# Patient Record
Sex: Female | Born: 1985 | Race: Black or African American | Hispanic: No | Marital: Married | Smoking: Never smoker
Health system: Southern US, Community
[De-identification: ages and names within clinical notes are randomized; demographics above are authoritative.]

## PROBLEM LIST (undated history)

## (undated) DIAGNOSIS — R87619 Unspecified abnormal cytological findings in specimens from cervix uteri: Secondary | ICD-10-CM

## (undated) DIAGNOSIS — R51 Headache: Secondary | ICD-10-CM

## (undated) DIAGNOSIS — K219 Gastro-esophageal reflux disease without esophagitis: Secondary | ICD-10-CM

## (undated) DIAGNOSIS — S82209A Unspecified fracture of shaft of unspecified tibia, initial encounter for closed fracture: Secondary | ICD-10-CM

## (undated) DIAGNOSIS — S069X9A Unspecified intracranial injury with loss of consciousness of unspecified duration, initial encounter: Secondary | ICD-10-CM

## (undated) DIAGNOSIS — IMO0002 Reserved for concepts with insufficient information to code with codable children: Secondary | ICD-10-CM

## (undated) DIAGNOSIS — M779 Enthesopathy, unspecified: Secondary | ICD-10-CM

## (undated) DIAGNOSIS — S82409A Unspecified fracture of shaft of unspecified fibula, initial encounter for closed fracture: Secondary | ICD-10-CM

## (undated) DIAGNOSIS — S069X1A Unspecified intracranial injury with loss of consciousness of 30 minutes or less, initial encounter: Secondary | ICD-10-CM

## (undated) DIAGNOSIS — R5383 Other fatigue: Secondary | ICD-10-CM

## (undated) HISTORY — PX: OTHER SURGICAL HISTORY: SHX169

## (undated) HISTORY — DX: Headache: R51

## (undated) HISTORY — PX: COLPOSCOPY W/ BIOPSY / CURETTAGE: SUR283

## (undated) HISTORY — PX: ARTHROSCOPIC REPAIR PCL: SUR81

## (undated) HISTORY — DX: Other fatigue: R53.83

## (undated) HISTORY — PX: DILATION AND CURETTAGE OF UTERUS: SHX78

---

## 2010-05-31 ENCOUNTER — Emergency Department (HOSPITAL_COMMUNITY): Admission: EM | Admit: 2010-05-31 | Discharge: 2010-05-31 | Payer: Self-pay | Admitting: Emergency Medicine

## 2010-09-29 LAB — POCT I-STAT, CHEM 8
BUN: 4 mg/dL — ABNORMAL LOW (ref 6–23)
Calcium, Ion: 1.14 mmol/L (ref 1.12–1.32)
Chloride: 103 meq/L (ref 96–112)
Creatinine, Ser: 0.8 mg/dL (ref 0.4–1.2)
Glucose, Bld: 90 mg/dL (ref 70–99)
HCT: 44 % (ref 36.0–46.0)
Hemoglobin: 15 g/dL (ref 12.0–15.0)
Potassium: 3.5 meq/L (ref 3.5–5.1)
Sodium: 139 meq/L (ref 135–145)
TCO2: 27 mmol/L (ref 0–100)

## 2010-09-29 LAB — POCT CARDIAC MARKERS: Myoglobin, poc: 49.2 ng/mL (ref 12–200)

## 2010-10-28 ENCOUNTER — Other Ambulatory Visit: Payer: Self-pay | Admitting: Family Medicine

## 2010-10-28 DIAGNOSIS — R102 Pelvic and perineal pain: Secondary | ICD-10-CM

## 2010-10-30 ENCOUNTER — Ambulatory Visit
Admission: RE | Admit: 2010-10-30 | Discharge: 2010-10-30 | Disposition: A | Discharge status: Home / Self Care | Payer: 59 | Source: Ambulatory Visit | Attending: Family Medicine | Admitting: Family Medicine

## 2010-10-30 DIAGNOSIS — R102 Pelvic and perineal pain: Secondary | ICD-10-CM

## 2010-11-04 ENCOUNTER — Other Ambulatory Visit: Payer: Self-pay | Admitting: Family Medicine

## 2010-11-04 DIAGNOSIS — R1032 Left lower quadrant pain: Secondary | ICD-10-CM

## 2010-11-05 ENCOUNTER — Ambulatory Visit
Admission: RE | Admit: 2010-11-05 | Discharge: 2010-11-05 | Disposition: A | Discharge status: Home / Self Care | Payer: 59 | Source: Ambulatory Visit | Attending: Family Medicine | Admitting: Family Medicine

## 2010-11-05 DIAGNOSIS — R1032 Left lower quadrant pain: Secondary | ICD-10-CM

## 2010-11-06 ENCOUNTER — Inpatient Hospital Stay: Admission: RE | Admit: 2010-11-06 | Payer: 59 | Source: Ambulatory Visit

## 2010-11-09 ENCOUNTER — Ambulatory Visit
Admission: RE | Admit: 2010-11-09 | Discharge: 2010-11-09 | Disposition: A | Discharge status: Home / Self Care | Payer: 59 | Source: Ambulatory Visit | Attending: Family Medicine | Admitting: Family Medicine

## 2010-11-09 ENCOUNTER — Other Ambulatory Visit: Payer: Self-pay | Admitting: Family Medicine

## 2010-11-09 DIAGNOSIS — R1032 Left lower quadrant pain: Secondary | ICD-10-CM

## 2010-11-19 ENCOUNTER — Other Ambulatory Visit: Payer: Self-pay | Admitting: Family Medicine

## 2010-11-23 LAB — HIV ANTIBODY (ROUTINE TESTING W REFLEX): HIV: NONREACTIVE

## 2010-11-23 LAB — HEPATITIS B SURFACE ANTIGEN: Hepatitis B Surface Ag: NEGATIVE

## 2010-11-23 LAB — RPR: RPR: NONREACTIVE

## 2010-12-25 ENCOUNTER — Other Ambulatory Visit: Payer: Self-pay | Admitting: Obstetrics & Gynecology

## 2010-12-25 DIAGNOSIS — Z3682 Encounter for antenatal screening for nuchal translucency: Secondary | ICD-10-CM

## 2010-12-30 ENCOUNTER — Inpatient Hospital Stay (HOSPITAL_COMMUNITY)
Admission: AD | Admit: 2010-12-30 | Discharge: 2010-12-30 | Disposition: A | Discharge status: Home / Self Care | Payer: 59 | Source: Ambulatory Visit | Attending: Obstetrics | Admitting: Obstetrics

## 2010-12-30 DIAGNOSIS — O210 Mild hyperemesis gravidarum: Secondary | ICD-10-CM | POA: Insufficient documentation

## 2010-12-30 DIAGNOSIS — R197 Diarrhea, unspecified: Secondary | ICD-10-CM

## 2010-12-30 LAB — URINALYSIS, ROUTINE W REFLEX MICROSCOPIC
Bilirubin Urine: NEGATIVE
Glucose, UA: NEGATIVE mg/dL
Ketones, ur: NEGATIVE mg/dL
Protein, ur: NEGATIVE mg/dL
pH: 6 (ref 5.0–8.0)

## 2010-12-30 LAB — URINE MICROSCOPIC-ADD ON

## 2010-12-31 ENCOUNTER — Ambulatory Visit (HOSPITAL_COMMUNITY)
Admission: RE | Admit: 2010-12-31 | Discharge: 2010-12-31 | Disposition: A | Discharge status: Home / Self Care | Payer: 59 | Source: Ambulatory Visit | Attending: Obstetrics & Gynecology | Admitting: Obstetrics & Gynecology

## 2010-12-31 DIAGNOSIS — Z3682 Encounter for antenatal screening for nuchal translucency: Secondary | ICD-10-CM

## 2010-12-31 DIAGNOSIS — Z3689 Encounter for other specified antenatal screening: Secondary | ICD-10-CM | POA: Insufficient documentation

## 2010-12-31 DIAGNOSIS — O3510X0 Maternal care for (suspected) chromosomal abnormality in fetus, unspecified, not applicable or unspecified: Secondary | ICD-10-CM | POA: Insufficient documentation

## 2010-12-31 DIAGNOSIS — O351XX0 Maternal care for (suspected) chromosomal abnormality in fetus, not applicable or unspecified: Secondary | ICD-10-CM | POA: Insufficient documentation

## 2011-01-19 ENCOUNTER — Ambulatory Visit (HOSPITAL_COMMUNITY): Payer: 59

## 2011-01-19 ENCOUNTER — Ambulatory Visit (HOSPITAL_COMMUNITY)
Admission: RE | Admit: 2011-01-19 | Discharge: 2011-01-19 | Disposition: A | Discharge status: Home / Self Care | Payer: 59 | Source: Ambulatory Visit | Attending: Obstetrics & Gynecology | Admitting: Obstetrics & Gynecology

## 2011-01-19 DIAGNOSIS — O351XX0 Maternal care for (suspected) chromosomal abnormality in fetus, not applicable or unspecified: Secondary | ICD-10-CM | POA: Insufficient documentation

## 2011-01-19 DIAGNOSIS — O3510X0 Maternal care for (suspected) chromosomal abnormality in fetus, unspecified, not applicable or unspecified: Secondary | ICD-10-CM | POA: Insufficient documentation

## 2011-01-21 ENCOUNTER — Other Ambulatory Visit: Payer: Self-pay | Admitting: Maternal and Fetal Medicine

## 2011-05-26 LAB — RPR: RPR: NONREACTIVE

## 2011-05-26 LAB — RUBELLA ANTIBODY, IGM: Rubella: IMMUNE

## 2011-05-26 LAB — HIV ANTIBODY (ROUTINE TESTING W REFLEX): HIV: NONREACTIVE

## 2011-06-14 ENCOUNTER — Inpatient Hospital Stay (HOSPITAL_COMMUNITY)
Admission: EM | Admit: 2011-06-14 | Discharge: 2011-06-14 | Disposition: A | Discharge status: Home / Self Care | Payer: 59 | Source: Ambulatory Visit | Attending: Obstetrics & Gynecology | Admitting: Obstetrics & Gynecology

## 2011-06-14 ENCOUNTER — Encounter (HOSPITAL_COMMUNITY): Payer: Self-pay | Admitting: *Deleted

## 2011-06-14 ENCOUNTER — Inpatient Hospital Stay (HOSPITAL_COMMUNITY): Payer: 59

## 2011-06-14 DIAGNOSIS — Y92410 Unspecified street and highway as the place of occurrence of the external cause: Secondary | ICD-10-CM | POA: Insufficient documentation

## 2011-06-14 DIAGNOSIS — O99891 Other specified diseases and conditions complicating pregnancy: Secondary | ICD-10-CM | POA: Insufficient documentation

## 2011-06-14 HISTORY — DX: Unspecified fracture of shaft of unspecified tibia, initial encounter for closed fracture: S82.209A

## 2011-06-14 HISTORY — DX: Unspecified fracture of shaft of unspecified fibula, initial encounter for closed fracture: S82.409A

## 2011-06-14 HISTORY — DX: Unspecified abnormal cytological findings in specimens from cervix uteri: R87.619

## 2011-06-14 HISTORY — DX: Unspecified intracranial injury with loss of consciousness of unspecified duration, initial encounter: S06.9X9A

## 2011-06-14 HISTORY — DX: Unspecified intracranial injury with loss of consciousness of 30 minutes or less, initial encounter: S06.9X1A

## 2011-06-14 HISTORY — DX: Headache: R51

## 2011-06-14 HISTORY — DX: Reserved for concepts with insufficient information to code with codable children: IMO0002

## 2011-06-14 LAB — CBC
Platelets: 238 10*3/uL (ref 150–400)
RBC: 3.78 MIL/uL — ABNORMAL LOW (ref 3.87–5.11)
RDW: 13.3 % (ref 11.5–15.5)
WBC: 13.9 10*3/uL — ABNORMAL HIGH (ref 4.0–10.5)

## 2011-06-14 LAB — KLEIHAUER-BETKE STAIN
# Vials RhIg: 1
Fetal Cells %: 0.2 %
Quantitation Fetal Hemoglobin: 10 mL

## 2011-06-14 MED ORDER — OXYCODONE-ACETAMINOPHEN 5-325 MG PO TABS: 2.0000 | ORAL_TABLET | Freq: Four times a day (QID) | ORAL | Status: AC | PRN

## 2011-06-14 MED ORDER — ACETAMINOPHEN 500 MG PO TABS
1000.0000 mg | ORAL_TABLET | Freq: Four times a day (QID) | ORAL | Status: DC | PRN
  Administered 2011-06-14: 1000 mg via ORAL
  Filled 2011-06-14: qty 2
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
## 2011-06-17 LAB — STREP B DNA PROBE: GBS: POSITIVE

## 2011-07-04 ENCOUNTER — Inpatient Hospital Stay (HOSPITAL_COMMUNITY)
Admission: AD | Admit: 2011-07-04 | Discharge: 2011-07-07 | DRG: 775 | Disposition: A | Discharge status: Home / Self Care | Payer: 59 | Source: Ambulatory Visit | Attending: Obstetrics & Gynecology | Admitting: Obstetrics & Gynecology

## 2011-07-04 ENCOUNTER — Encounter (HOSPITAL_COMMUNITY): Payer: Self-pay | Admitting: Anesthesiology

## 2011-07-04 ENCOUNTER — Encounter (HOSPITAL_COMMUNITY): Payer: Self-pay

## 2011-07-04 ENCOUNTER — Inpatient Hospital Stay (HOSPITAL_COMMUNITY): Payer: 59 | Admitting: Anesthesiology

## 2011-07-04 DIAGNOSIS — O4700 False labor before 37 completed weeks of gestation, unspecified trimester: Secondary | ICD-10-CM | POA: Diagnosis present

## 2011-07-04 DIAGNOSIS — Z22330 Carrier of Group B streptococcus: Secondary | ICD-10-CM

## 2011-07-04 DIAGNOSIS — O99892 Other specified diseases and conditions complicating childbirth: Secondary | ICD-10-CM | POA: Diagnosis present

## 2011-07-04 DIAGNOSIS — O4290 Premature rupture of membranes, unspecified as to length of time between rupture and onset of labor, unspecified weeks of gestation: Secondary | ICD-10-CM | POA: Diagnosis present

## 2011-07-04 LAB — CBC
HCT: 35.8 % — ABNORMAL LOW (ref 36.0–46.0)
MCH: 31 pg (ref 26.0–34.0)
MCHC: 33.5 g/dL (ref 30.0–36.0)
MCV: 92.5 fL (ref 78.0–100.0)
Platelets: 211 10*3/uL (ref 150–400)
RDW: 13.6 % (ref 11.5–15.5)
WBC: 15.4 10*3/uL — ABNORMAL HIGH (ref 4.0–10.5)

## 2011-07-04 MED ORDER — PHENYLEPHRINE 40 MCG/ML (10ML) SYRINGE FOR IV PUSH (FOR BLOOD PRESSURE SUPPORT): 80.0000 ug | PREFILLED_SYRINGE | INTRAVENOUS | Status: DC | PRN

## 2011-07-04 MED ORDER — CITRIC ACID-SODIUM CITRATE 334-500 MG/5ML PO SOLN: 30.0000 mL | ORAL | Status: DC | PRN

## 2011-07-04 MED ORDER — FENTANYL 2.5 MCG/ML BUPIVACAINE 1/10 % EPIDURAL INFUSION (WH - ANES): 14.0000 mL/h | INTRAMUSCULAR | Status: DC

## 2011-07-04 MED ORDER — FENTANYL 2.5 MCG/ML BUPIVACAINE 1/10 % EPIDURAL INFUSION (WH - ANES)
14.0000 mL/h | INTRAMUSCULAR | Status: DC
Start: 2011-07-04 — End: 2011-07-05
  Administered 2011-07-05: 14 mL/h via EPIDURAL
  Filled 2011-07-04 (×2): qty 60

## 2011-07-04 MED ORDER — LIDOCAINE HCL (PF) 1 % IJ SOLN: 30.0000 mL | INTRAMUSCULAR | Status: DC | PRN

## 2011-07-04 MED ORDER — EPHEDRINE 5 MG/ML INJ: 10.0000 mg | INTRAVENOUS | Status: DC | PRN

## 2011-07-04 MED ORDER — FLEET ENEMA 7-19 GM/118ML RE ENEM: 1.0000 | ENEMA | RECTAL | Status: DC | PRN

## 2011-07-04 MED ORDER — OXYTOCIN 20 UNITS IN LACTATED RINGERS INFUSION - SIMPLE: 125.0000 mL/h | Freq: Once | INTRAVENOUS | Status: DC

## 2011-07-04 MED ORDER — LIDOCAINE HCL 1.5 % IJ SOLN
INTRAMUSCULAR | Status: DC | PRN
  Administered 2011-07-04 (×2): 5 mL via EPIDURAL

## 2011-07-04 MED ORDER — BUTORPHANOL TARTRATE 2 MG/ML IJ SOLN: 1.0000 mg | INTRAMUSCULAR | Status: DC | PRN

## 2011-07-04 MED ORDER — OXYCODONE-ACETAMINOPHEN 5-325 MG PO TABS: 2.0000 | ORAL_TABLET | ORAL | Status: DC | PRN

## 2011-07-04 MED ORDER — LACTATED RINGERS IV SOLN
INTRAVENOUS | Status: DC
  Administered 2011-07-05: 03:00:00 via INTRAVENOUS

## 2011-07-04 MED ORDER — OXYTOCIN 20 UNITS IN LACTATED RINGERS INFUSION - SIMPLE
125.0000 mL/h | Freq: Once | INTRAVENOUS | Status: AC
  Administered 2011-07-05: 500 mL/h via INTRAVENOUS

## 2011-07-04 MED ORDER — LACTATED RINGERS IV SOLN: 500.0000 mL | INTRAVENOUS | Status: DC | PRN

## 2011-07-04 MED ORDER — LACTATED RINGERS IV SOLN: 500.0000 mL | Freq: Once | INTRAVENOUS | Status: DC

## 2011-07-04 MED ORDER — ONDANSETRON HCL 4 MG/2ML IJ SOLN: 4.0000 mg | Freq: Four times a day (QID) | INTRAMUSCULAR | Status: DC | PRN

## 2011-07-04 MED ORDER — PENICILLIN G POTASSIUM 5000000 UNITS IJ SOLR
5.0000 10*6.[IU] | Freq: Once | INTRAVENOUS | Status: DC
  Administered 2011-07-04: 5 10*6.[IU] via INTRAVENOUS
  Filled 2011-07-04: qty 5

## 2011-07-04 MED ORDER — BUTORPHANOL TARTRATE 2 MG/ML IJ SOLN
1.0000 mg | INTRAMUSCULAR | Status: DC | PRN
  Administered 2011-07-04 (×4): 1 mg via INTRAVENOUS
  Filled 2011-07-04 (×4): qty 1

## 2011-07-04 MED ORDER — ACETAMINOPHEN 325 MG PO TABS: 650.0000 mg | ORAL_TABLET | ORAL | Status: DC | PRN

## 2011-07-04 MED ORDER — DIPHENHYDRAMINE HCL 50 MG/ML IJ SOLN: 12.5000 mg | INTRAMUSCULAR | Status: DC | PRN

## 2011-07-04 MED ORDER — OXYTOCIN BOLUS FROM INFUSION
500.0000 mL | Freq: Once | INTRAVENOUS | Status: DC
  Filled 2011-07-04: qty 500

## 2011-07-04 MED ORDER — TERBUTALINE SULFATE 1 MG/ML IJ SOLN: 0.2500 mg | Freq: Once | INTRAMUSCULAR | Status: AC | PRN

## 2011-07-04 MED ORDER — LIDOCAINE HCL (PF) 1 % IJ SOLN
30.0000 mL | INTRAMUSCULAR | Status: DC | PRN
  Filled 2011-07-04 (×2): qty 30

## 2011-07-04 MED ORDER — FENTANYL 2.5 MCG/ML BUPIVACAINE 1/10 % EPIDURAL INFUSION (WH - ANES)
INTRAMUSCULAR | Status: DC | PRN
  Administered 2011-07-04: 14 mL/h via EPIDURAL

## 2011-07-04 MED ORDER — EPHEDRINE 5 MG/ML INJ
10.0000 mg | INTRAVENOUS | Status: DC | PRN
Start: 2011-07-04 — End: 2011-07-05
  Filled 2011-07-04: qty 4

## 2011-07-04 MED ORDER — DEXTROSE 5 % IV SOLN
2.5000 10*6.[IU] | INTRAVENOUS | Status: DC
  Administered 2011-07-04 (×4): 2.5 10*6.[IU] via INTRAVENOUS
  Filled 2011-07-04 (×6): qty 2.5

## 2011-07-04 MED ORDER — PENICILLIN G POTASSIUM 5000000 UNITS IJ SOLR
2.5000 10*6.[IU] | INTRAVENOUS | Status: DC
  Administered 2011-07-04 – 2011-07-05 (×2): 2.5 10*6.[IU] via INTRAVENOUS
  Filled 2011-07-04 (×5): qty 2.5

## 2011-07-04 MED ORDER — PHENYLEPHRINE 40 MCG/ML (10ML) SYRINGE FOR IV PUSH (FOR BLOOD PRESSURE SUPPORT)
80.0000 ug | PREFILLED_SYRINGE | INTRAVENOUS | Status: DC | PRN
  Filled 2011-07-04: qty 5

## 2011-07-04 MED ORDER — LACTATED RINGERS IV SOLN
INTRAVENOUS | Status: DC
  Administered 2011-07-04 (×3): via INTRAVENOUS

## 2011-07-04 MED ORDER — OXYTOCIN 20 UNITS IN LACTATED RINGERS INFUSION - SIMPLE
1.0000 m[IU]/min | INTRAVENOUS | Status: DC
  Administered 2011-07-04: 2 m[IU]/min via INTRAVENOUS

## 2011-07-04 MED ORDER — OXYTOCIN BOLUS FROM INFUSION
500.0000 mL | Freq: Once | INTRAVENOUS | Status: DC
  Filled 2011-07-04: qty 1000
  Filled 2011-07-04: qty 500

## 2011-07-04 MED ORDER — IBUPROFEN 600 MG PO TABS: 600.0000 mg | ORAL_TABLET | Freq: Four times a day (QID) | ORAL | Status: DC | PRN

## 2011-07-04 NOTE — Anesthesia Preprocedure Evaluation (Signed)
Anesthesia Evaluation  Patient identified by MRN, date of birth, ID band Patient awake    Reviewed: Allergy & Precautions, H&P , NPO status , Patient's Chart, lab work & pertinent test results  Airway Mallampati: II TM Distance: >3 FB Neck ROM: full    Dental No notable dental hx.    Pulmonary neg pulmonary ROS,    Pulmonary exam normal       Cardiovascular neg cardio ROS     Neuro/Psych Negative Psych ROS   GI/Hepatic Neg liver ROS, PUD,   Endo/Other  Morbid obesity  Renal/GU negative Renal ROS  Genitourinary negative   Musculoskeletal negative musculoskeletal ROS (+)   Abdominal (+) obese,   Peds negative pediatric ROS (+)  Hematology negative hematology ROS (+)   Anesthesia Other Findings   Reproductive/Obstetrics (+) Pregnancy                           Anesthesia Physical Anesthesia Plan  ASA: III  Anesthesia Plan: Epidural   Post-op Pain Management:    Induction:   Airway Management Planned:   Additional Equipment:   Intra-op Plan:   Post-operative Plan:   Informed Consent: I have reviewed the patients History and Physical, chart, labs and discussed the procedure including the risks, benefits and alternatives for the proposed anesthesia with the patient or authorized representative who has indicated his/her understanding and acceptance.     Plan Discussed with:   Anesthesia Plan Comments:         Anesthesia Quick Evaluation

## 2011-07-04 NOTE — Progress Notes (Signed)
Talk with pt about epidural, questions answered.  Pt states she can't do this any more.  Trying to decide about epidural.

## 2011-07-04 NOTE — Progress Notes (Signed)
Pt states. " At 11:45 pm I felt a gush of water and I felt down there and water was on my hand. I took a shower and more water ran out. I have started to feel contractions now too."

## 2011-07-04 NOTE — Progress Notes (Signed)
Dr Tamela Oddi notified of patient, her complaints of rom, tracing , ctx pattern, sve result and positive clear pooling per walidah muhammed cnm. Order obtained to admit patient l/d unit.

## 2011-07-04 NOTE — Anesthesia Procedure Notes (Signed)
Epidural Patient location during procedure: OB Start time: 07/04/2011 10:00 PM End time: 07/04/2011 10:05 PM Reason for block: procedure for pain  Staffing Anesthesiologist: Sandrea Hughs Performed by: anesthesiologist   Preanesthetic Checklist Completed: patient identified, site marked, surgical consent, pre-op evaluation, timeout performed, IV checked, risks and benefits discussed and monitors and equipment checked  Epidural Patient position: sitting Prep: site prepped and draped and DuraPrep Patient monitoring: continuous pulse ox and blood pressure Approach: midline Injection technique: LOR air  Needle:  Needle type: Tuohy  Needle gauge: 17 G Needle length: 9 cm Needle insertion depth: 7 cm Catheter type: closed end flexible Catheter size: 19 Gauge Catheter at skin depth: 13 cm Test dose: negative and 1.5% lidocaine  Assessment Sensory level: T8 Events: blood not aspirated, injection not painful, no injection resistance, negative IV test and no paresthesia

## 2011-07-04 NOTE — Progress Notes (Signed)
Patient is here with c/o rupture of membranes at about 2345pm. She states that it was clear. Report irregular contractions and good fetal movement.

## 2011-07-04 NOTE — H&P (Signed)
Jamie Mathis is Mathis 25 y.o. female presenting for SROM. Maternal Medical History:  Reason for admission: Reason for admission: rupture of membranes.  Contractions: Frequency: irregular.    Fetal activity: Perceived fetal activity is normal.    Prenatal complications: no prenatal complications   OB History    Grav Para Term Preterm Abortions TAB SAB Ect Mult Living   3    2 1 1    0     Past Medical History  Diagnosis Date  . Abnormal Pap smear   . Fractured tibia and fibula   . Closed head injury with brief loss of consciousness   . Headache    Past Surgical History  Procedure Date  . Colposcopy w/ biopsy / curettage   . Bunyan   . Arthroscopic repair pcl   . Dilation and curettage of uterus     tab   Family History: family history includes Alcohol abuse in her father; Arthritis in her maternal grandfather, paternal grandfather, and paternal grandmother; Cancer in her paternal grandfather and paternal grandmother; Diabetes in her father, paternal grandfather, and paternal grandmother; Heart disease in her maternal grandfather and maternal grandmother; Hypertension in her brother, father, maternal aunt, maternal grandfather, maternal grandmother, maternal uncle, mother, paternal aunt, paternal grandfather, paternal grandmother, and paternal uncle; Kidney disease in her mother; and Stroke in her paternal grandmother. Social History:  reports that she has never smoked. She does not have any smokeless tobacco history on file. She reports that she does not drink alcohol or use illicit drugs.  Review of Systems  Constitutional: Negative for fever.  Eyes: Negative for blurred vision.  Respiratory: Negative for shortness of breath.   Gastrointestinal: Negative for vomiting.  Skin: Negative for rash.  Neurological: Negative for headaches.    Dilation: 1 Effacement (%): 50 Station: -1 Exam by:: B.cagna,RN Blood pressure 125/103, pulse 101, temperature 97.8 F (36.6 C), temperature  source Oral, resp. rate 20, height 5\' 4"  (1.626 m), weight 89.812 kg (198 lb), SpO2 99.00%. Maternal Exam:  Uterine Assessment: Contraction frequency is irregular.   Abdomen: Patient reports no abdominal tenderness. Fetal presentation: vertex  Introitus: not evaluated.     Fetal Exam Fetal Monitor Review: Variability: moderate (6-25 bpm).   Pattern: accelerations present and no decelerations.    Fetal State Assessment: Category I - tracings are normal.     Physical Exam  Constitutional: She appears well-developed.  HENT:  Head: Normocephalic.  Neck: Neck supple. No thyromegaly present.  Cardiovascular: Normal rate and regular rhythm.   Respiratory: Breath sounds normal.  GI: Soft. Bowel sounds are normal.  Skin: No rash noted.    Prenatal labs: ABO, Rh: O/Positive/-- (11/07 0000) Antibody: Negative (11/07 0000) Rubella: Immune (11/07 0000) RPR: Nonreactive (11/07 0000)  HBsAg: Negative (05/07 0000)  HIV: Non-reactive (11/07 0000)  GBS: Positive (11/29 0000)   Assessment/Plan: 25 y.o. P0 w/an IUP @ [redacted]w[redacted]d.  SROM.  Prodromal labor.  GBS positive.  Admit Likely augmentation of labor with low dose Pitocin per protocol PCN GBS prophylaxis   [name],Jamie Mathis 07/04/2011, 10:47 AM

## 2011-07-04 NOTE — Progress Notes (Signed)
Called Dr. Enrigue Catena to inform her of pt cervix not changing.  Orders received for pitocin.

## 2011-07-05 ENCOUNTER — Encounter (HOSPITAL_COMMUNITY): Payer: Self-pay | Admitting: *Deleted

## 2011-07-05 MED ORDER — METHYLERGONOVINE MALEATE 0.2 MG/ML IJ SOLN: 0.2000 mg | INTRAMUSCULAR | Status: DC | PRN

## 2011-07-05 MED ORDER — ONDANSETRON HCL 4 MG PO TABS: 4.0000 mg | ORAL_TABLET | ORAL | Status: DC | PRN

## 2011-07-05 MED ORDER — DIBUCAINE 1 % RE OINT: 1.0000 "application " | TOPICAL_OINTMENT | RECTAL | Status: DC | PRN

## 2011-07-05 MED ORDER — BENZOCAINE-MENTHOL 20-0.5 % EX AERO: 1.0000 "application " | INHALATION_SPRAY | CUTANEOUS | Status: DC | PRN

## 2011-07-05 MED ORDER — ZOLPIDEM TARTRATE 5 MG PO TABS: 5.0000 mg | ORAL_TABLET | Freq: Every evening | ORAL | Status: DC | PRN

## 2011-07-05 MED ORDER — TETANUS-DIPHTH-ACELL PERTUSSIS 5-2.5-18.5 LF-MCG/0.5 IM SUSP
0.5000 mL | Freq: Once | INTRAMUSCULAR | Status: AC
  Administered 2011-07-06: 0.5 mL via INTRAMUSCULAR
  Filled 2011-07-05: qty 0.5

## 2011-07-05 MED ORDER — IBUPROFEN 600 MG PO TABS
600.0000 mg | ORAL_TABLET | Freq: Four times a day (QID) | ORAL | Status: DC
  Administered 2011-07-05 – 2011-07-07 (×8): 600 mg via ORAL
  Filled 2011-07-05 (×8): qty 1

## 2011-07-05 MED ORDER — DIPHENHYDRAMINE HCL 25 MG PO CAPS: 25.0000 mg | ORAL_CAPSULE | Freq: Four times a day (QID) | ORAL | Status: DC | PRN

## 2011-07-05 MED ORDER — METHYLERGONOVINE MALEATE 0.2 MG PO TABS: 0.2000 mg | ORAL_TABLET | ORAL | Status: DC | PRN

## 2011-07-05 MED ORDER — PRENATAL PLUS 27-1 MG PO TABS
1.0000 | ORAL_TABLET | Freq: Every day | ORAL | Status: DC
  Administered 2011-07-05 – 2011-07-07 (×3): 1 via ORAL
  Filled 2011-07-05 (×3): qty 1

## 2011-07-05 MED ORDER — LANOLIN HYDROUS EX OINT: TOPICAL_OINTMENT | CUTANEOUS | Status: DC | PRN

## 2011-07-05 MED ORDER — ONDANSETRON HCL 4 MG/2ML IJ SOLN: 4.0000 mg | INTRAMUSCULAR | Status: DC | PRN

## 2011-07-05 MED ORDER — SIMETHICONE 80 MG PO CHEW: 80.0000 mg | CHEWABLE_TABLET | ORAL | Status: DC | PRN

## 2011-07-05 MED ORDER — WITCH HAZEL-GLYCERIN EX PADS: 1.0000 "application " | MEDICATED_PAD | CUTANEOUS | Status: DC | PRN

## 2011-07-05 MED ORDER — SENNOSIDES-DOCUSATE SODIUM 8.6-50 MG PO TABS
2.0000 | ORAL_TABLET | Freq: Every day | ORAL | Status: DC
  Administered 2011-07-05 – 2011-07-06 (×2): 2 via ORAL

## 2011-07-05 MED ORDER — OXYTOCIN 20 UNITS IN LACTATED RINGERS INFUSION - SIMPLE
125.0000 mL/h | INTRAVENOUS | Status: DC
  Administered 2011-07-05: 125 mL/h via INTRAVENOUS

## 2011-07-05 MED ORDER — OXYCODONE-ACETAMINOPHEN 5-325 MG PO TABS
1.0000 | ORAL_TABLET | ORAL | Status: DC | PRN
  Administered 2011-07-05: 1 via ORAL
  Filled 2011-07-05: qty 1

## 2011-07-05 MED ORDER — MEDROXYPROGESTERONE ACETATE 150 MG/ML IM SUSP: 150.0000 mg | INTRAMUSCULAR | Status: DC | PRN

## 2011-07-05 MED ORDER — OXYTOCIN 10 UNIT/ML IJ SOLN
INTRAMUSCULAR | Status: AC
  Administered 2011-07-05: 20 [IU]
  Filled 2011-07-05: qty 2

## 2011-07-05 MED ORDER — OXYTOCIN 20 UNITS IN LACTATED RINGERS INFUSION - SIMPLE: 125.0000 mL/h | INTRAVENOUS | Status: DC | PRN

## 2011-07-05 NOTE — Progress Notes (Signed)
Jamie Mathis is a 25 y.o. G3P1021 at [redacted]w[redacted]d by LMP admitted for rupture of membranes  Subjective:   Objective: BP 137/91  Pulse 114  Temp(Src) 98.1 F (36.7 C) (Oral)  Resp 18  Ht 5\' 4"  (1.626 m)  Wt 89.812 kg (198 lb)  BMI 33.99 kg/m2  SpO2 99%  Breastfeeding? Unknown   Total I/O In: -  Out: 600 [Urine:600]  FHT:  FHR: 150 bpm, variability: moderate,  accelerations:  Present,  decelerations:  Absent UC:   regular, every 2 minutes SVE:   Dilation: 10 Effacement (%): 100 Station: +2 Exam by:: BCagna,RN  Labs: Lab Results  Component Value Date   WBC 15.4* 07/04/2011   HGB 12.0 07/04/2011   HCT 35.8* 07/04/2011   MCV 92.5 07/04/2011   PLT 211 07/04/2011    Assessment / Plan: Induction of labor due to PROM,  progressing well on pitocin  Labor: Progressing on Pitocin, will continue to increase then AROM Preeclampsia:  n/a Fetal Wellbeing:  Category I Pain Control:  Epidural I/D:  n/a Anticipated MOD:  NSVD  HARPER,CHARLES A 07/05/2011, 5:08 AM  Delivery Note At 4:27 AM a viable female was delivered via Vaginal, Spontaneous Delivery (Presentation: ;  ).  APGAR: 9, 9; weight 7 lb 9 oz (3430 g).   Placenta status: Intact, Expressed.  Cord: 3 vessels with the following complications: None.  Cord pH: none  Anesthesia: Epidural  Episiotomy:  Lacerations: 1st degree Suture Repair: 3.0 vicryl Est. Blood Loss (mL):   Mom to postpartum.  Baby to nursery-stable.  HARPER,CHARLES A 07/05/2011, 5:09 AM

## 2011-07-05 NOTE — Anesthesia Postprocedure Evaluation (Signed)
Anesthesia Post Note  Patient: Jamie Mathis  Procedure(s) Performed: * No procedures listed *  Anesthesia type: Epidural  Patient location: Mother/Baby  Post pain: Pain level controlled  Post assessment: Post-op Vital signs reviewed  Last Vitals:  Filed Vitals:   07/05/11 0531  BP: 149/88  Pulse: 100  Temp:   Resp:     Post vital signs: Reviewed  Level of consciousness: awake  Complications: No apparent anesthesia complications

## 2011-07-05 NOTE — Progress Notes (Signed)
Jamie Mathis is Mathis 25 y.o. G3P0020 at [redacted]w[redacted]d by LMP admitted for rupture of membranes  Subjective:   Objective: BP 117/69  Pulse 134  Temp(Src) 98.1 F (36.7 C) (Oral)  Resp 18  Ht 5\' 4"  (1.626 m)  Wt 89.812 kg (198 lb)  BMI 33.99 kg/m2  SpO2 99%   Total I/O In: -  Out: 600 [Urine:600]  FHT:  FHR: 150 bpm, variability: moderate,  accelerations:  Present,  decelerations:  Absent UC:   regular, every 2 minutes SVE:   Dilation: 10 Effacement (%): 100 Station: +2 Exam by:: BCagna,RN  Labs: Lab Results  Component Value Date   WBC 15.4* 07/04/2011   HGB 12.0 07/04/2011   HCT 35.8* 07/04/2011   MCV 92.5 07/04/2011   PLT 211 07/04/2011    Assessment / Plan: Induction of labor due to PROM,  progressing well on pitocin  Labor: Progressing on Pitocin, will continue to increase then AROM Preeclampsia:  n/Mathis Fetal Wellbeing:  Category I Pain Control:  Epidural I/D:  n/Mathis Anticipated MOD:  NSVD  Jamie Mathis 07/05/2011, 4:11 AM

## 2011-07-06 LAB — CBC
HCT: 30.5 % — ABNORMAL LOW (ref 36.0–46.0)
Hemoglobin: 10 g/dL — ABNORMAL LOW (ref 12.0–15.0)
MCH: 30.6 pg (ref 26.0–34.0)
MCHC: 32.8 g/dL (ref 30.0–36.0)

## 2011-07-06 NOTE — Anesthesia Postprocedure Evaluation (Signed)
  Anesthesia Post Note  Patient: Jamie Mathis  Procedure(s) Performed: * No procedures listed *  Anesthesia type: Epidural  Patient location: Mother/Baby  Post pain: Pain level controlled  Post assessment: Post-op Vital signs reviewed  Last Vitals:  Filed Vitals:   07/06/11 0645  BP: 117/77  Pulse: 77  Temp: 36.6 C  Resp: 19    Post vital signs: Reviewed  Level of consciousness:alert  Complications: No apparent anesthesia complications

## 2011-07-06 NOTE — Addendum Note (Signed)
Addendum  created 07/06/11 0811 by Byan Poplaski   Modules edited:Charges VN, Notes Section    

## 2011-07-06 NOTE — Addendum Note (Signed)
Addendum  created 07/06/11 9604 by Cephus Shelling   Modules edited:Charges VN, Notes Section

## 2011-07-06 NOTE — Progress Notes (Signed)
Post Partum Day 1 Subjective: up ad lib, voiding, tolerating PO, + flatus and breastfeeding.  Objective: Blood pressure 117/77, pulse 77, temperature 97.9 F (36.6 C), temperature source Oral, resp. rate 19, height 5\' 4"  (1.626 m), weight 89.812 kg (198 lb), SpO2 97.00%, unknown if currently breastfeeding.  Physical Exam:  General: alert, cooperative, appears stated age and no distress Lochia: appropriate Uterine Fundus: firm Incision: healing well, no significant drainage, no significant erythema, 1st degree perineal laceration. DVT Evaluation: No evidence of DVT seen on physical exam. Negative Homan's sign. No cords or calf tenderness.   Basename 07/06/11 0520 07/04/11 0210  HGB 10.0* 12.0  HCT 30.5* 35.8*    Assessment/Plan: Plan for discharge tomorrow, Breastfeeding, Lactation consult and Contraception undecided.   LOS: 2 days   HAMBY, Opie Fanton 07/06/2011, 8:44 AM

## 2011-07-07 MED ORDER — FERROUS SULFATE 325 (65 FE) MG PO TABS: 325.0000 mg | ORAL_TABLET | Freq: Every day | ORAL | Status: DC

## 2011-07-07 MED ORDER — IBUPROFEN 600 MG PO TABS: 600.0000 mg | ORAL_TABLET | Freq: Four times a day (QID) | ORAL | Status: AC

## 2011-07-07 NOTE — Progress Notes (Signed)
Post Partum Day 2 Subjective: no complaints, up ad lib, voiding, tolerating PO, + flatus and breastfeeding going well, desires DC home today.  Objective: Blood pressure 118/70, pulse 69, temperature 97.8 F (36.6 C), temperature source Oral, resp. rate 18, height 5\' 4"  (1.626 m), weight 89.812 kg (198 lb), SpO2 97.00%, unknown if currently breastfeeding.  Physical Exam:  General: alert, cooperative, appears stated age and no distress Lochia: appropriate Uterine Fundus: firm Incision: healing well, no significant drainage, no dehiscence, no significant erythema, (1st deg perineal laceration).  DVT Evaluation: No evidence of DVT seen on physical exam. Negative Homan's sign. No cords or calf tenderness. No significant calf/ankle edema.   Basename 07/06/11 0520  HGB 10.0*  HCT 30.5*    Assessment/Plan: Discharge home, Breastfeeding and Contraception Depo-Provera at postpartum visit.    LOS: 3 days   HAMBY, Maui Ahart 07/07/2011, 7:33 AM

## 2011-07-07 NOTE — Discharge Summary (Signed)
Obstetric Discharge Summary Reason for Admission: onset of labor Prenatal Procedures: NST and ultrasound Intrapartum Procedures: spontaneous vaginal delivery Postpartum Procedures: none Complications-Operative and Postpartum: 1st degree perineal laceration Hemoglobin  Date Value Range Status  07/06/2011 10.0* 12.0-15.0 (g/dL) Final     HCT  Date Value Range Status  07/06/2011 30.5* 36.0-46.0 (%) Final    Discharge Diagnoses: Term Pregnancy-delivered  Discharge Information: Date: 07/07/2011 Activity: pelvic rest Diet: routine Medications: PNV, Ibuprofen and Iron Condition: stable Instructions: see written DC instructions Discharge to: home Follow-up Information    Follow up with Roseanna Rainbow, MD. Make an appointment in 6 weeks. (call for earlier appt  if symptoms worsen)    Contact information:   [address] [place] [address] [phone]          Newborn Data: Live born female  Birth Weight: 7 lb 9 oz (3430 g) APGAR: 9, 9  Home with mother.  HAMBY, Ilani Otterson 07/07/2011, 7:41 AM

## 2012-03-07 ENCOUNTER — Encounter (HOSPITAL_BASED_OUTPATIENT_CLINIC_OR_DEPARTMENT_OTHER): Payer: Self-pay | Admitting: Emergency Medicine

## 2012-03-07 ENCOUNTER — Emergency Department (HOSPITAL_BASED_OUTPATIENT_CLINIC_OR_DEPARTMENT_OTHER): Payer: No Typology Code available for payment source

## 2012-03-07 ENCOUNTER — Emergency Department (HOSPITAL_BASED_OUTPATIENT_CLINIC_OR_DEPARTMENT_OTHER)
Admission: EM | Admit: 2012-03-07 | Discharge: 2012-03-07 | Disposition: A | Discharge status: Home / Self Care | Payer: No Typology Code available for payment source | Attending: Emergency Medicine | Admitting: Emergency Medicine

## 2012-03-07 DIAGNOSIS — Z809 Family history of malignant neoplasm, unspecified: Secondary | ICD-10-CM | POA: Insufficient documentation

## 2012-03-07 DIAGNOSIS — Z8249 Family history of ischemic heart disease and other diseases of the circulatory system: Secondary | ICD-10-CM | POA: Insufficient documentation

## 2012-03-07 DIAGNOSIS — K219 Gastro-esophageal reflux disease without esophagitis: Secondary | ICD-10-CM | POA: Insufficient documentation

## 2012-03-07 DIAGNOSIS — Z6379 Other stressful life events affecting family and household: Secondary | ICD-10-CM | POA: Insufficient documentation

## 2012-03-07 DIAGNOSIS — Z833 Family history of diabetes mellitus: Secondary | ICD-10-CM | POA: Insufficient documentation

## 2012-03-07 DIAGNOSIS — S0093XA Contusion of unspecified part of head, initial encounter: Secondary | ICD-10-CM

## 2012-03-07 DIAGNOSIS — S0003XA Contusion of scalp, initial encounter: Secondary | ICD-10-CM | POA: Insufficient documentation

## 2012-03-07 DIAGNOSIS — M542 Cervicalgia: Secondary | ICD-10-CM | POA: Insufficient documentation

## 2012-03-07 DIAGNOSIS — S40022A Contusion of left upper arm, initial encounter: Secondary | ICD-10-CM

## 2012-03-07 DIAGNOSIS — S40029A Contusion of unspecified upper arm, initial encounter: Secondary | ICD-10-CM | POA: Insufficient documentation

## 2012-03-07 DIAGNOSIS — S1093XA Contusion of unspecified part of neck, initial encounter: Secondary | ICD-10-CM | POA: Insufficient documentation

## 2012-03-07 DIAGNOSIS — Z8261 Family history of arthritis: Secondary | ICD-10-CM | POA: Insufficient documentation

## 2012-03-07 DIAGNOSIS — Z841 Family history of disorders of kidney and ureter: Secondary | ICD-10-CM | POA: Insufficient documentation

## 2012-03-07 HISTORY — DX: Gastro-esophageal reflux disease without esophagitis: K21.9

## 2012-03-07 MED ORDER — HYDROCODONE-ACETAMINOPHEN 5-325 MG PO TABS
2.0000 | ORAL_TABLET | Freq: Once | ORAL | Status: AC
  Administered 2012-03-07: 2 via ORAL
  Filled 2012-03-07: qty 2

## 2012-03-07 MED ORDER — IBUPROFEN 800 MG PO TABS: 800.0000 mg | ORAL_TABLET | Freq: Three times a day (TID) | ORAL | Status: AC
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
## 2012-03-08 NOTE — ED Provider Notes (Signed)
Medical screening examination/treatment/procedure(s) were performed by non-physician practitioner and as supervising physician I was immediately available for consultation/collaboration.   Gwyneth Sprout, MD 03/08/12 2244
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
## 2013-02-27 DIAGNOSIS — R5383 Other fatigue: Secondary | ICD-10-CM

## 2013-02-27 DIAGNOSIS — R519 Headache, unspecified: Secondary | ICD-10-CM

## 2013-02-27 DIAGNOSIS — M79672 Pain in left foot: Secondary | ICD-10-CM | POA: Insufficient documentation

## 2013-02-27 DIAGNOSIS — K219 Gastro-esophageal reflux disease without esophagitis: Secondary | ICD-10-CM

## 2013-02-27 HISTORY — PX: KNEE SURGERY: SHX244

## 2013-02-27 HISTORY — DX: Other fatigue: R53.83

## 2013-02-27 HISTORY — PX: BUNIONECTOMY: SHX129

## 2013-02-27 HISTORY — DX: Headache, unspecified: R51.9

## 2013-02-27 HISTORY — DX: Gastro-esophageal reflux disease without esophagitis: K21.9

## 2013-04-02 ENCOUNTER — Encounter: Payer: Self-pay | Admitting: Podiatry

## 2013-04-02 DIAGNOSIS — M79672 Pain in left foot: Secondary | ICD-10-CM

## 2013-04-04 ENCOUNTER — Encounter (HOSPITAL_BASED_OUTPATIENT_CLINIC_OR_DEPARTMENT_OTHER): Payer: Self-pay | Admitting: Emergency Medicine

## 2013-04-19 ENCOUNTER — Encounter: Payer: Self-pay | Admitting: Podiatry

## 2013-04-19 ENCOUNTER — Ambulatory Visit (INDEPENDENT_AMBULATORY_CARE_PROVIDER_SITE_OTHER): Payer: BC Managed Care – PPO

## 2013-04-19 ENCOUNTER — Ambulatory Visit (INDEPENDENT_AMBULATORY_CARE_PROVIDER_SITE_OTHER): Payer: BC Managed Care – PPO | Admitting: Podiatry

## 2013-04-19 VITALS — BP 117/78 | HR 81 | Temp 98.9°F | Resp 16

## 2013-04-19 DIAGNOSIS — M79672 Pain in left foot: Secondary | ICD-10-CM

## 2013-04-19 DIAGNOSIS — Z472 Encounter for removal of internal fixation device: Secondary | ICD-10-CM

## 2013-04-19 DIAGNOSIS — M79609 Pain in unspecified limb: Secondary | ICD-10-CM

## 2013-04-19 NOTE — Progress Notes (Signed)
This patient presents today one week status post removal of painful internal fixation first metatarsal of the left foot. She presents today in her Darco shoe with a dry sterile dressing intact. Denies fever chills nausea vomiting muscle aches and pains.  Objective: Dry sterile dressing intact today once removed reveals no erythema no cellulitis no drainage no odor. Sutures are intact there were small incision margins are well coapted area mild tenderness on range of motion of the first metatarsophalangeal joint left. This is not only associated with the surgical site but the hallux abductovalgus deformity that is present.  Assessment: Well-healing surgical foot left  Plan: Redressed today with a dry sterile compressive dressing. We'll followup with her in one week for suture removal.

## 2013-04-19 NOTE — Patient Instructions (Signed)
Continue elevation.  May wash foot and soak in epsom salts and warm water after washing.  Wear anklet and continue shoe.

## 2013-04-26 ENCOUNTER — Ambulatory Visit (INDEPENDENT_AMBULATORY_CARE_PROVIDER_SITE_OTHER): Payer: BC Managed Care – PPO | Admitting: Podiatry

## 2013-04-26 ENCOUNTER — Encounter: Payer: Self-pay | Admitting: Podiatry

## 2013-04-26 DIAGNOSIS — M79609 Pain in unspecified limb: Secondary | ICD-10-CM

## 2013-04-26 NOTE — Progress Notes (Signed)
Subjective:     Patient ID: Jamie Mathis, female   DOB: [birth year], 27 y.o.   MRN: [id number]  HPI She presents today for 2 week status post removal of deep internal fixation first metatarsal of the left foot. She denies fever chills nausea vomiting muscle aches and pains. States that the sutures appear to be:Marland Kitchen  Review of Systems Review of systems unremarkable.    Objective:   Physical Exam Vital signs are stable she is alert and oriented x3. Pulses are palpable bilateral lower extremity. Suture  are intact margins are well coapted. There is no erythema edema saline is drainage or odor.    Assessment:     Well-healing surgical foot left.    Plan:     Followup with her on as-needed basis.

## 2013-05-03 ENCOUNTER — Encounter: Payer: Self-pay | Admitting: Obstetrics

## 2013-05-08 ENCOUNTER — Encounter: Payer: BC Managed Care – PPO | Admitting: Podiatry

## 2013-05-29 ENCOUNTER — Ambulatory Visit: Payer: Self-pay

## 2013-05-29 ENCOUNTER — Encounter: Payer: Self-pay | Admitting: Podiatry

## 2013-05-29 ENCOUNTER — Ambulatory Visit (INDEPENDENT_AMBULATORY_CARE_PROVIDER_SITE_OTHER): Payer: BC Managed Care – PPO | Admitting: Podiatry

## 2013-05-29 VITALS — BP 117/64 | HR 90 | Resp 16 | Ht 64.0 in | Wt 172.0 lb

## 2013-05-29 DIAGNOSIS — IMO0002 Reserved for concepts with insufficient information to code with codable children: Secondary | ICD-10-CM

## 2013-05-29 DIAGNOSIS — Z9889 Other specified postprocedural states: Secondary | ICD-10-CM

## 2013-05-29 DIAGNOSIS — M792 Neuralgia and neuritis, unspecified: Secondary | ICD-10-CM

## 2013-05-29 NOTE — Progress Notes (Signed)
Ms Rhodia Albright presents today with a chief complaint of numbness in her foot and leg. She states when she crosses her legs her left leg goes numb. Even sitting in a car seat it will go numb.  Objective: Pulses remain strongly palpable left lower extremity. She has no loss of sensorium per Semmes-Weinstein monofilament of the left foot. Neuromuscular structures are intact. She has no pain on percussion of the tarsal tunnel.  Assessment: Positional neurapraxia.  Plan: We discussed the etiology pathology conservative versus surgical therapies. I encouraged her to have I nerve conduction velocity exam performed she declined I will followup with her on an as-needed basis.

## 2013-08-01 ENCOUNTER — Other Ambulatory Visit: Payer: Self-pay | Admitting: Family Medicine

## 2013-08-01 ENCOUNTER — Other Ambulatory Visit (HOSPITAL_COMMUNITY)
Admission: RE | Admit: 2013-08-01 | Discharge: 2013-08-01 | Disposition: A | Discharge status: Home / Self Care | Payer: BC Managed Care – PPO | Source: Ambulatory Visit | Attending: Family Medicine | Admitting: Family Medicine

## 2013-08-01 DIAGNOSIS — Z124 Encounter for screening for malignant neoplasm of cervix: Secondary | ICD-10-CM | POA: Insufficient documentation

## 2014-05-20 ENCOUNTER — Encounter: Payer: Self-pay | Admitting: Podiatry

## 2014-05-20 ENCOUNTER — Other Ambulatory Visit: Payer: Self-pay | Admitting: Gastroenterology

## 2014-05-20 DIAGNOSIS — K219 Gastro-esophageal reflux disease without esophagitis: Secondary | ICD-10-CM

## 2014-05-20 DIAGNOSIS — R1310 Dysphagia, unspecified: Secondary | ICD-10-CM

## 2014-05-23 ENCOUNTER — Other Ambulatory Visit: Payer: BC Managed Care – PPO

## 2014-05-23 ENCOUNTER — Ambulatory Visit
Admission: RE | Admit: 2014-05-23 | Discharge: 2014-05-23 | Disposition: A | Discharge status: Home / Self Care | Payer: BC Managed Care – PPO | Source: Ambulatory Visit | Attending: Gastroenterology | Admitting: Gastroenterology

## 2014-05-23 DIAGNOSIS — R1310 Dysphagia, unspecified: Secondary | ICD-10-CM

## 2014-05-23 DIAGNOSIS — K219 Gastro-esophageal reflux disease without esophagitis: Secondary | ICD-10-CM

## 2015-02-24 ENCOUNTER — Ambulatory Visit: Payer: Self-pay | Admitting: Neurology

## 2015-02-27 ENCOUNTER — Encounter: Payer: Self-pay | Admitting: *Deleted

## 2015-02-27 NOTE — Progress Notes (Signed)
No show letter sent for 02/24/2015 

## 2016-02-04 ENCOUNTER — Encounter (HOSPITAL_COMMUNITY): Payer: Self-pay | Admitting: *Deleted

## 2016-02-04 ENCOUNTER — Other Ambulatory Visit: Payer: Self-pay | Admitting: Obstetrics & Gynecology

## 2016-02-05 MED ORDER — CEFAZOLIN SODIUM-DEXTROSE 2-4 GM/100ML-% IV SOLN
2.0000 g | INTRAVENOUS | Status: AC
  Administered 2016-02-06: 2 g via INTRAVENOUS

## 2016-02-06 ENCOUNTER — Ambulatory Visit (HOSPITAL_COMMUNITY): Payer: Managed Care, Other (non HMO) | Admitting: Anesthesiology

## 2016-02-06 ENCOUNTER — Encounter (HOSPITAL_COMMUNITY): Payer: Self-pay

## 2016-02-06 ENCOUNTER — Ambulatory Visit (HOSPITAL_COMMUNITY)
Admission: RE | Admit: 2016-02-06 | Discharge: 2016-02-06 | Disposition: A | Discharge status: Home / Self Care | Payer: Managed Care, Other (non HMO) | Source: Ambulatory Visit | Attending: Obstetrics & Gynecology | Admitting: Obstetrics & Gynecology

## 2016-02-06 ENCOUNTER — Encounter (HOSPITAL_COMMUNITY)
Admission: RE | Disposition: A | Discharge status: Home / Self Care | Payer: Self-pay | Source: Ambulatory Visit | Attending: Obstetrics & Gynecology

## 2016-02-06 DIAGNOSIS — Z640 Problems related to unwanted pregnancy: Secondary | ICD-10-CM | POA: Diagnosis present

## 2016-02-06 DIAGNOSIS — Z3A10 10 weeks gestation of pregnancy: Secondary | ICD-10-CM | POA: Diagnosis present

## 2016-02-06 HISTORY — DX: Enthesopathy, unspecified: M77.9

## 2016-02-06 HISTORY — PX: DILATION AND EVACUATION: SHX1459

## 2016-02-06 LAB — CBC
HCT: 37.3 % (ref 36.0–46.0)
Hemoglobin: 12.8 g/dL (ref 12.0–15.0)
MCH: 30.8 pg (ref 26.0–34.0)
MCHC: 34.3 g/dL (ref 30.0–36.0)
MCV: 89.7 fL (ref 78.0–100.0)
PLATELETS: 307 10*3/uL (ref 150–400)
RBC: 4.16 MIL/uL (ref 3.87–5.11)
RDW: 13.2 % (ref 11.5–15.5)
WBC: 16.1 10*3/uL — AB (ref 4.0–10.5)

## 2016-02-06 SURGERY — DILATION AND EVACUATION, UTERUS
Anesthesia: General

## 2016-02-06 MED ORDER — LACTATED RINGERS IV SOLN: INTRAVENOUS | Status: DC

## 2016-02-06 MED ORDER — LIDOCAINE HCL (CARDIAC) 20 MG/ML IV SOLN
INTRAVENOUS | Status: DC | PRN
  Administered 2016-02-06: 100 mg via INTRAVENOUS

## 2016-02-06 MED ORDER — FENTANYL CITRATE (PF) 100 MCG/2ML IJ SOLN
INTRAMUSCULAR | Status: AC
  Filled 2016-02-06: qty 2

## 2016-02-06 MED ORDER — PROPOFOL 10 MG/ML IV BOLUS
INTRAVENOUS | Status: AC
Start: 2016-02-06 — End: 2016-02-06
  Filled 2016-02-06: qty 20

## 2016-02-06 MED ORDER — MIDAZOLAM HCL 2 MG/2ML IJ SOLN
INTRAMUSCULAR | Status: AC
  Filled 2016-02-06: qty 2

## 2016-02-06 MED ORDER — CHLOROPROCAINE HCL 1 % IJ SOLN
INTRAMUSCULAR | Status: AC
  Filled 2016-02-06: qty 30

## 2016-02-06 MED ORDER — DEXAMETHASONE SODIUM PHOSPHATE 10 MG/ML IJ SOLN
INTRAMUSCULAR | Status: AC
Start: 2016-02-06 — End: 2016-02-06
  Filled 2016-02-06: qty 1

## 2016-02-06 MED ORDER — KETOROLAC TROMETHAMINE 30 MG/ML IJ SOLN
INTRAMUSCULAR | Status: DC | PRN
  Administered 2016-02-06: 30 mg via INTRAVENOUS

## 2016-02-06 MED ORDER — FENTANYL CITRATE (PF) 100 MCG/2ML IJ SOLN: 25.0000 ug | INTRAMUSCULAR | Status: DC | PRN

## 2016-02-06 MED ORDER — DEXAMETHASONE SODIUM PHOSPHATE 4 MG/ML IJ SOLN
INTRAMUSCULAR | Status: DC | PRN
  Administered 2016-02-06: 10 mg via INTRAVENOUS

## 2016-02-06 MED ORDER — SCOPOLAMINE 1 MG/3DAYS TD PT72
MEDICATED_PATCH | TRANSDERMAL | Status: AC
  Filled 2016-02-06: qty 1

## 2016-02-06 MED ORDER — MIDAZOLAM HCL 5 MG/5ML IJ SOLN
INTRAMUSCULAR | Status: DC | PRN
  Administered 2016-02-06: 2 mg via INTRAVENOUS

## 2016-02-06 MED ORDER — MEPERIDINE HCL 25 MG/ML IJ SOLN
6.2500 mg | INTRAMUSCULAR | Status: DC | PRN
Start: 2016-02-06 — End: 2016-02-06

## 2016-02-06 MED ORDER — CHLOROPROCAINE HCL 1 % IJ SOLN
INTRAMUSCULAR | Status: DC | PRN
Start: 2016-02-06 — End: 2016-02-06
  Administered 2016-02-06: 20 mL

## 2016-02-06 MED ORDER — LACTATED RINGERS IV SOLN
INTRAVENOUS | Status: DC
  Administered 2016-02-06 (×2): via INTRAVENOUS

## 2016-02-06 MED ORDER — ONDANSETRON HCL 4 MG/2ML IJ SOLN
INTRAMUSCULAR | Status: AC
  Filled 2016-02-06: qty 2

## 2016-02-06 MED ORDER — SCOPOLAMINE 1 MG/3DAYS TD PT72
1.0000 | MEDICATED_PATCH | Freq: Once | TRANSDERMAL | Status: DC
  Administered 2016-02-06: 1.5 mg via TRANSDERMAL

## 2016-02-06 MED ORDER — ONDANSETRON HCL 4 MG/2ML IJ SOLN
INTRAMUSCULAR | Status: DC | PRN
  Administered 2016-02-06: 4 mg via INTRAVENOUS

## 2016-02-06 MED ORDER — LIDOCAINE HCL (CARDIAC) 20 MG/ML IV SOLN
INTRAVENOUS | Status: AC
  Filled 2016-02-06: qty 5

## 2016-02-06 MED ORDER — METHYLERGONOVINE MALEATE 0.2 MG/ML IJ SOLN
INTRAMUSCULAR | Status: DC | PRN
  Administered 2016-02-06: 0.2 mg via INTRAMUSCULAR

## 2016-02-06 MED ORDER — FENTANYL CITRATE (PF) 100 MCG/2ML IJ SOLN
INTRAMUSCULAR | Status: DC | PRN
  Administered 2016-02-06: 100 ug via INTRAVENOUS

## 2016-02-06 MED ORDER — IBUPROFEN 600 MG PO TABS: 600.0000 mg | ORAL_TABLET | Freq: Four times a day (QID) | ORAL | Status: DC | PRN

## 2016-02-06 MED ORDER — PROMETHAZINE HCL 25 MG/ML IJ SOLN: 6.2500 mg | INTRAMUSCULAR | Status: DC | PRN

## 2016-02-06 MED ORDER — METHYLERGONOVINE MALEATE 0.2 MG PO TABS: 0.2000 mg | ORAL_TABLET | Freq: Three times a day (TID) | ORAL | Status: AC

## 2016-02-06 MED ORDER — KETOROLAC TROMETHAMINE 30 MG/ML IJ SOLN
INTRAMUSCULAR | Status: AC
Start: 2016-02-06 — End: 2016-02-06
  Filled 2016-02-06: qty 1

## 2016-02-06 MED ORDER — PROPOFOL 10 MG/ML IV BOLUS
INTRAVENOUS | Status: DC | PRN
  Administered 2016-02-06: 200 mg via INTRAVENOUS

## 2016-02-06 SURGICAL SUPPLY — 18 items
CATH ROBINSON RED A/P 16FR (CATHETERS) ×3 IMPLANT
CLOTH BEACON ORANGE TIMEOUT ST (SAFETY) ×3 IMPLANT
DECANTER SPIKE VIAL GLASS SM (MISCELLANEOUS) ×3 IMPLANT
GLOVE BIO SURGEON STRL SZ7 (GLOVE) ×3 IMPLANT
GLOVE BIOGEL PI IND STRL 7.0 (GLOVE) ×2 IMPLANT
GLOVE BIOGEL PI INDICATOR 7.0 (GLOVE) ×4
GOWN STRL REUS W/TWL LRG LVL3 (GOWN DISPOSABLE) ×6 IMPLANT
KIT BERKELEY 1ST TRIMESTER 3/8 (MISCELLANEOUS) ×3 IMPLANT
NS IRRIG 1000ML POUR BTL (IV SOLUTION) ×3 IMPLANT
PACK VAGINAL MINOR WOMEN LF (CUSTOM PROCEDURE TRAY) ×3 IMPLANT
PAD OB MATERNITY 4.3X12.25 (PERSONAL CARE ITEMS) ×3 IMPLANT
PAD PREP 24X48 CUFFED NSTRL (MISCELLANEOUS) ×3 IMPLANT
SET BERKELEY SUCTION TUBING (SUCTIONS) ×3 IMPLANT
TOWEL OR 17X24 6PK STRL BLUE (TOWEL DISPOSABLE) ×6 IMPLANT
VACURETTE 10 RIGID CVD (CANNULA) ×3 IMPLANT
VACURETTE 7MM CVD STRL WRAP (CANNULA) IMPLANT
VACURETTE 8 RIGID CVD (CANNULA) IMPLANT
VACURETTE 9 RIGID CVD (CANNULA) IMPLANT
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
## 2016-02-06 NOTE — Anesthesia Procedure Notes (Signed)
Procedure Name: LMA Insertion Date/Time: 02/06/2016 1:38 PM Performed by: Junious SilkGILBERT, Zai Chmiel Pre-anesthesia Checklist: Patient identified, Emergency Drugs available, Suction available, Patient being monitored and Timeout performed Patient Re-evaluated:Patient Re-evaluated prior to inductionOxygen Delivery Method: Circle system utilized Preoxygenation: Pre-oxygenation with 100% oxygen Intubation Type: IV induction Ventilation: Mask ventilation without difficulty LMA: LMA inserted LMA Size: 4.0 Number of attempts: 1 Placement Confirmation: positive ETCO2,  CO2 detector and breath sounds checked- equal and bilateral Tube secured with: Tape Dental Injury: Teeth and Oropharynx as per pre-operative assessment

## 2016-02-06 NOTE — Anesthesia Postprocedure Evaluation (Signed)
Anesthesia Post Note  Patient: Jamie Mathis  Procedure(s) Performed: Procedure(s) (LRB): DILATATION AND EVACUATION (N/A)  Patient location during evaluation: PACU Anesthesia Type: General Level of consciousness: awake and alert Pain management: pain level controlled Vital Signs Assessment: post-procedure vital signs reviewed and stable Respiratory status: spontaneous breathing, nonlabored ventilation, respiratory function stable and patient connected to nasal cannula oxygen Cardiovascular status: blood pressure returned to baseline and stable Postop Assessment: no signs of nausea or vomiting Anesthetic complications: no     Last Vitals:  Filed Vitals:   02/06/16 1515 02/06/16 1555  BP: 120/69 125/77  Pulse: 71 73  Temp: 36.8 C 36.8 C  Resp: 14 14    Last Pain:  Filed Vitals:   02/06/16 1607  PainSc: 0-No pain   Pain Goal: Patients Stated Pain Goal: 0 (02/06/16 1525)               Shelton SilvasKevin D Darrin Apodaca

## 2016-02-06 NOTE — Anesthesia Preprocedure Evaluation (Addendum)
Anesthesia Evaluation  Patient identified by MRN, date of birth, ID band Patient awake    Reviewed: Allergy & Precautions, NPO status , Patient's Chart, lab work & pertinent test results  Airway Mallampati: I       Dental  (+) Teeth Intact, Dental Advisory Given   Pulmonary neg pulmonary ROS,    breath sounds clear to auscultation       Cardiovascular negative cardio ROS   Rhythm:Regular Rate:Normal     Neuro/Psych  Headaches, negative psych ROS   GI/Hepatic Neg liver ROS, GERD  Medicated and Controlled,  Endo/Other  negative endocrine ROS  Renal/GU negative Renal ROS  negative genitourinary   Musculoskeletal negative musculoskeletal ROS (+)   Abdominal   Peds negative pediatric ROS (+)  Hematology negative hematology ROS (+)   Anesthesia Other Findings   Reproductive/Obstetrics (+) Pregnancy                            Lab Results  Component Value Date   WBC 14.1* 07/06/2011   HGB 10.0* 07/06/2011   HCT 30.5* 07/06/2011   MCV 93.3 07/06/2011   PLT 191 07/06/2011   Lab Results  Component Value Date   CREATININE 0.8 05/31/2010   BUN 4* 05/31/2010   NA 139 05/31/2010   K 3.5 05/31/2010   CL 103 05/31/2010   No results found for: INR, PROTIME   Anesthesia Physical Anesthesia Plan  ASA: II  Anesthesia Plan: General   Post-op Pain Management:    Induction: Intravenous  Airway Management Planned: LMA  Additional Equipment:   Intra-op Plan:   Post-operative Plan: Extubation in OR  Informed Consent: I have reviewed the patients History and Physical, chart, labs and discussed the procedure including the risks, benefits and alternatives for the proposed anesthesia with the patient or authorized representative who has indicated his/her understanding and acceptance.   Dental advisory given  Plan Discussed with: CRNA  Anesthesia Plan Comments:         Anesthesia  Quick Evaluation

## 2016-02-06 NOTE — Op Note (Signed)
Preoperative diagnosis: Unwanted pregnancy 10 weeks Postop diagnosis: as above.  Procedure: Suction Dilatation & Evacuation Anesthesia General - LMA and paracervical block. Surgeon: Shea EvansVaishali Chanda Laperle, MD Assistant: none IV fluids : 1000 cc LR Estimated blood loss: 50 cc   Urine output: 5 cc straight catheter preop Complications none Condition stable Disposition PACU  Specimen: products of conception sent to pathology.  Procedure  Indication: 10 weeks unwanted pregnancy. Desires termination due to social reasons. Decision was made to proceed with suction D&E. Risks/ complications of surgery including infection, bleeding, damage to internal organs including uterine perforation, Asherman syndrome were reviewed. She voiced understanding and gave informed written consent.  Patient was brought to the operating room with IV running. She received 2 gm Ancef. She underwent general LMA anesthesia without complications. She was given dorsolithotomy position. Parts were prepped and draped in standard fashion. Bladder was catheterized once.  Bimanual exam revealed uterus to be anteverted and 12 week size. Speculum was placed and cervix was grasped with single-tooth tenaculum. Cervical os was dilated to 27 [place] dilator. A # 10 suction curette was introduced in the uterine cavity and suction evacuation of products of conception was done in multiple step wise fashion. Gentle sharp curettage was performed. Suction cannula was reintroduced.  Patient was given Methergine 0.2 mg IM for prophylaxis.  The bleeding was controlled well and uterus was firm on exam.  At this point the procedure was complete.  All counts are correct x2. Specimen products of conception. No complications. Patient was made supine dorsal anesthesia and brought to the recovery room in stable condition.  Patient will be discharged home today. Discharge medications Ibuprofen, Methergine prn. Warning signs of infection and excessive bleeding  reviewed.  V.Arieh Bogue, MD.

## 2016-02-06 NOTE — Discharge Instructions (Addendum)
Dilation and Curettage or Vacuum Curettage, Care After These instructions give you information on caring for yourself after your procedure. Your doctor may also give you more specific instructions. Call your doctor if you have any problems or questions after your procedure. HOME CARE  Do not drive for 24 hours.  Wait 1 week before doing any activities that wear you out.  Take your temperature 2 times a day for 4 days. Write it down. Tell your doctor if you have a fever.  Do not stand for a long time.  Do not lift, push, or pull anything over 10 pounds (4.5 kilograms).  Limit stair climbing to once or twice a day.  Rest often.  Continue with your usual diet.  Drink enough fluids to keep your pee (urine) clear or pale yellow.  If you have a hard time pooping (constipation), you may:  Take a medicine to help you go poop (laxative) as told by your doctor.  Eat more fruit and bran.  Drink more fluids.  Take showers, not baths, for as long as told by your doctor.  Do not swim or use a hot tub until your doctor says it is okay.  Have someone with you for 1-2 days after the procedure.  Do not douche, use tampons, or have sex (intercourse) for 2 weeks.  Only take medicines as told by your doctor. Do not take aspirin. It can cause bleeding.  Keep all doctor visits. GET HELP IF:  You have cramps or pain not helped by medicine.  You have new pain in the belly (abdomen).  You have a bad smelling fluid coming from your vagina.  You have a rash.  You have problems with any medicine. GET HELP RIGHT AWAY IF:   You start to bleed more than a regular period.  You have a fever.  You have chest pain.  You have trouble breathing.  You feel dizzy or feel like passing out (fainting).  You pass out.  You have pain in the tops of your shoulders.  You have vaginal bleeding with or without clumps of blood (blood clots). MAKE SURE YOU:  Understand these instructions.  Will  watch your condition.  Will get help right away if you are not doing well or get worse.   This information is not intended to replace advice given to you by your health care provider. Make sure you discuss any questions you have with your health care provider.   Document Released: 04/13/2008 Document Revised: 07/10/2013 Document Reviewed: 02/01/2013 Elsevier Interactive Patient Education 2016 [employer].  Post Anesthesia Home Care Instructions  Activity: Get plenty of rest for the remainder of the day. A responsible adult should stay with you for 24 hours following the procedure.  For the next 24 hours, DO NOT: -Drive a car -Advertising copywriter -Drink alcoholic beverages -Take any medication unless instructed by your physician -Make any legal decisions or sign important papers.  Meals: Start with liquid foods such as gelatin or soup. Progress to regular foods as tolerated. Avoid greasy, spicy, heavy foods. If nausea and/or vomiting occur, drink only clear liquids until the nausea and/or vomiting subsides. Call your physician if vomiting continues.  Special Instructions/Symptoms: Your throat may feel dry or sore from the anesthesia or the breathing tube placed in your throat during surgery. If this causes discomfort, gargle with warm salt water. The discomfort should disappear within 24 hours.  If you had a scopolamine patch placed behind your ear for the management of post-  operative nausea and/or vomiting:  1. The medication in the patch is effective for 72 hours, after which it should be removed.  Wrap patch in a tissue and discard in the trash. Wash hands thoroughly with soap and water. 2. You may remove the patch earlier than 72 hours if you experience unpleasant side effects which may include dry mouth, dizziness or visual disturbances. 3. Avoid touching the patch. Wash your hands with soap and water after contact with the patch.

## 2016-02-06 NOTE — Transfer of Care (Signed)
Immediate Anesthesia Transfer of Care Note  Patient: Jamie Mathis  Procedure(s) Performed: Procedure(s): DILATATION AND EVACUATION (N/A)  Patient Location: PACU  Anesthesia Type:General  Level of Consciousness: sedated  Airway & Oxygen Therapy: Patient Spontanous Breathing and Patient connected to nasal cannula oxygen  Post-op Assessment: Report given to RN and Post -op Vital signs reviewed and stable  Post vital signs: Reviewed and stable  Last Vitals:  Filed Vitals:   02/06/16 1150 02/06/16 1409  BP: 122/81   Pulse: 77 79  Temp: 36.8 C 36.8 C  Resp: 18     Last Pain: There were no vitals filed for this visit.    Patients Stated Pain Goal: 3 (02/06/16 1150)  Complications: No apparent anesthesia complications

## 2016-02-11 ENCOUNTER — Encounter (HOSPITAL_COMMUNITY): Payer: Self-pay | Admitting: Obstetrics & Gynecology

## 2016-09-17 ENCOUNTER — Ambulatory Visit (INDEPENDENT_AMBULATORY_CARE_PROVIDER_SITE_OTHER): Payer: Managed Care, Other (non HMO) | Admitting: Neurology

## 2016-09-17 ENCOUNTER — Encounter: Payer: Self-pay | Admitting: Neurology

## 2016-09-17 VITALS — BP 100/70 | HR 88 | Ht 64.0 in | Wt 176.2 lb

## 2016-09-17 DIAGNOSIS — IMO0002 Reserved for concepts with insufficient information to code with codable children: Secondary | ICD-10-CM

## 2016-09-17 DIAGNOSIS — G43709 Chronic migraine without aura, not intractable, without status migrainosus: Secondary | ICD-10-CM

## 2016-09-17 MED ORDER — DIHYDROERGOTAMINE MESYLATE 4 MG/ML NA SOLN: NASAL | 12 refills | Status: DC

## 2016-09-17 MED ORDER — VENLAFAXINE HCL ER 75 MG PO CP24: 75.0000 mg | ORAL_CAPSULE | Freq: Every day | ORAL | 1 refills | Status: DC

## 2016-09-17 NOTE — Patient Instructions (Signed)
Migraine Recommendations: 1.  Start venlafaxine XR 75mg  daily every morning with breakfast..  Call in 6 weeks with update and we can adjust dose if needed. 2.  At earliest onset of migraine, use Migranal, 1 spray in each nostril.  May repeat every 15 minutes x 2.  Do not exceed 6 sprays in 24 hours or 8 sprays in 1 week. 3.  Limit use of pain relievers to no more than 2 days out of the week.  These medications include acetaminophen, ibuprofen, triptans and narcotics.  This will help reduce risk of rebound headaches. 4.  Be aware of common food triggers such as processed sweets, processed foods with nitrites (such as deli meat, hot dogs, sausages), foods with MSG, alcohol (such as wine), chocolate, certain cheeses, certain fruits (dried fruits, some citrus fruit), vinegar, diet soda. 4.  Avoid caffeine 5.  Routine exercise 6.  Proper sleep hygiene 7.  Stay adequately hydrated with water 8.  Keep a headache diary. 9.  Maintain proper stress management. 10.  Do not skip meals. 11.  Consider supplements:  Magnesium citrate 400mg  to 600mg  daily, riboflavin 400mg , Coenzyme Q 10 100mg  three times daily 12.  Follow up in 3 months.
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
## 2016-09-22 ENCOUNTER — Telehealth: Payer: Self-pay | Admitting: Neurology

## 2016-09-22 MED ORDER — ZONISAMIDE 100 MG PO CAPS: 100.0000 mg | ORAL_CAPSULE | Freq: Every day | ORAL | 1 refills | Status: AC

## 2016-09-22 NOTE — Telephone Encounter (Signed)
Jamie Mathis 1985-10-09. Her # [phone]. She just recently started taking Effexor. She is not doing well with it. She said she  Woke up "wet and naked" she has also been dizzy. She would like you to please call her. Thank you

## 2016-09-22 NOTE — Telephone Encounter (Signed)
Pt having side effects on venlafaxine xr 75 mg QHS. Started med 09/17/16. Please advise.

## 2016-09-22 NOTE — Telephone Encounter (Signed)
rx sent to [facility]. Message relayed to patient. Verbalized understanding and denied questions.

## 2016-09-22 NOTE — Telephone Encounter (Signed)
She should stop the Effexor.  When she feels better, she may start zonisamide 100mg  daily.  It is in the same family as Topamax but usually people tolerate it much better.  She should contact us in 6 weeks with update (or sooner if not tolerating) and we can adjust dose if needed.

## 2016-10-18 ENCOUNTER — Ambulatory Visit: Payer: Managed Care, Other (non HMO) | Admitting: Neurology

## 2016-12-29 ENCOUNTER — Ambulatory Visit: Payer: Managed Care, Other (non HMO) | Admitting: Neurology

## 2016-12-30 ENCOUNTER — Encounter: Payer: Self-pay | Admitting: Neurology

## 2016-12-30 ENCOUNTER — Ambulatory Visit (INDEPENDENT_AMBULATORY_CARE_PROVIDER_SITE_OTHER): Payer: Managed Care, Other (non HMO) | Admitting: Neurology

## 2016-12-30 VITALS — BP 110/70 | HR 94 | Ht 64.0 in | Wt 169.0 lb

## 2016-12-30 DIAGNOSIS — G43009 Migraine without aura, not intractable, without status migrainosus: Secondary | ICD-10-CM | POA: Diagnosis not present

## 2016-12-30 MED ORDER — DIHYDROERGOTAMINE MESYLATE 4 MG/ML NA SOLN: NASAL | 12 refills | Status: AC

## 2016-12-30 MED ORDER — PREDNISONE 10 MG PO TABS: ORAL_TABLET | ORAL | 0 refills | Status: AC
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
## 2016-12-30 NOTE — Patient Instructions (Signed)
1.  To help break current cycle of migraines, I will prescribe you a prednisone 10mg  tablet taper:  Take 6tabs x1day, then 5tabs x1day, then 4tabs x1day, then 3tabs x1day, then 2tabs x1day, then 1tab x1day, then STOP 2.  Use Migranal spray as needed 3.  Recommend these supplements/vitamins:  Magnesium citrate 400mg  to 600mg  daily, riboflavin 400mg , Coenzyme Q 10 100mg  three times daily 4.  Follow up in 4 months.

## 2017-03-23 ENCOUNTER — Other Ambulatory Visit: Payer: Self-pay | Admitting: Obstetrics & Gynecology

## 2017-03-23 DIAGNOSIS — N63 Unspecified lump in unspecified breast: Secondary | ICD-10-CM

## 2017-03-29 ENCOUNTER — Ambulatory Visit
Admission: RE | Admit: 2017-03-29 | Discharge: 2017-03-29 | Disposition: A | Discharge status: Home / Self Care | Payer: Managed Care, Other (non HMO) | Source: Ambulatory Visit | Attending: Obstetrics & Gynecology | Admitting: Obstetrics & Gynecology

## 2017-03-29 ENCOUNTER — Other Ambulatory Visit: Payer: Self-pay | Admitting: Obstetrics & Gynecology

## 2017-03-29 DIAGNOSIS — N6489 Other specified disorders of breast: Secondary | ICD-10-CM

## 2017-03-29 DIAGNOSIS — N63 Unspecified lump in unspecified breast: Secondary | ICD-10-CM

## 2017-03-29 DIAGNOSIS — N6310 Unspecified lump in the right breast, unspecified quadrant: Secondary | ICD-10-CM

## 2017-04-05 ENCOUNTER — Other Ambulatory Visit: Payer: Self-pay | Admitting: Obstetrics & Gynecology

## 2017-04-05 DIAGNOSIS — N6310 Unspecified lump in the right breast, unspecified quadrant: Secondary | ICD-10-CM

## 2017-04-06 ENCOUNTER — Ambulatory Visit
Admission: RE | Admit: 2017-04-06 | Discharge: 2017-04-06 | Disposition: A | Discharge status: Home / Self Care | Payer: Managed Care, Other (non HMO) | Source: Ambulatory Visit | Attending: Obstetrics & Gynecology | Admitting: Obstetrics & Gynecology

## 2017-04-06 DIAGNOSIS — N6310 Unspecified lump in the right breast, unspecified quadrant: Secondary | ICD-10-CM

## 2017-04-15 ENCOUNTER — Ambulatory Visit: Payer: Managed Care, Other (non HMO) | Admitting: Neurology

## 2017-05-02 ENCOUNTER — Ambulatory Visit: Payer: Managed Care, Other (non HMO) | Admitting: Neurology
